# Patient Record
Sex: Male | Born: 2000 | Race: White | Hispanic: No | Marital: Single | Smoking: Never smoker
Health system: Southern US, Community
[De-identification: ages and names within clinical notes are randomized; demographics above are authoritative.]

## PROBLEM LIST (undated history)

## (undated) DIAGNOSIS — R030 Elevated blood-pressure reading, without diagnosis of hypertension: Secondary | ICD-10-CM

## (undated) DIAGNOSIS — F909 Attention-deficit hyperactivity disorder, unspecified type: Secondary | ICD-10-CM

## (undated) DIAGNOSIS — T7840XA Allergy, unspecified, initial encounter: Secondary | ICD-10-CM

## (undated) DIAGNOSIS — K011 Impacted teeth: Secondary | ICD-10-CM

## (undated) DIAGNOSIS — T8859XA Other complications of anesthesia, initial encounter: Secondary | ICD-10-CM

## (undated) DIAGNOSIS — R1110 Vomiting, unspecified: Secondary | ICD-10-CM

## (undated) DIAGNOSIS — H9191 Unspecified hearing loss, right ear: Secondary | ICD-10-CM

## (undated) DIAGNOSIS — E669 Obesity, unspecified: Secondary | ICD-10-CM

## (undated) DIAGNOSIS — T4145XA Adverse effect of unspecified anesthetic, initial encounter: Secondary | ICD-10-CM

## (undated) HISTORY — DX: Elevated blood-pressure reading, without diagnosis of hypertension: R03.0

## (undated) HISTORY — DX: Vomiting, unspecified: R11.10

## (undated) HISTORY — PX: TYMPANOSTOMY TUBE PLACEMENT: SHX32

## (undated) HISTORY — DX: Unspecified hearing loss, right ear: H91.91

---
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
## 2022-02-28 ENCOUNTER — Ambulatory Visit (INDEPENDENT_AMBULATORY_CARE_PROVIDER_SITE_OTHER): Payer: Medicaid Other | Admitting: Family Medicine

## 2022-02-28 ENCOUNTER — Encounter: Payer: Self-pay | Admitting: Family Medicine

## 2022-02-28 ENCOUNTER — Ambulatory Visit: Payer: Medicaid Other | Admitting: Nurse Practitioner

## 2022-02-28 VITALS — BP 126/88 | HR 73 | Temp 97.7°F | Ht 73.5 in | Wt 352.5 lb

## 2022-02-28 DIAGNOSIS — M26609 Unspecified temporomandibular joint disorder, unspecified side: Secondary | ICD-10-CM

## 2022-02-28 DIAGNOSIS — F458 Other somatoform disorders: Secondary | ICD-10-CM | POA: Diagnosis not present

## 2022-02-28 MED ORDER — MELOXICAM 15 MG PO TABS: 15.0000 mg | ORAL_TABLET | Freq: Every day | ORAL | 0 refills | Status: AC | PRN

## 2022-02-28 NOTE — Patient Instructions (Signed)
Temporomandibular Joint Syndrome  Temporomandibular joint syndrome (TMJ syndrome) is a condition that causes pain in the temporomandibular joints. These joints are located near your ears and allow your jaw to open and close. For people with TMJ syndrome, chewing, biting, or other movements of the jaw can be difficult or painful. TMJ syndrome is often mild and goes away within a few weeks. However, sometimes the condition becomes a long-term (chronic) problem. What are the causes? This condition may be caused by: Grinding your teeth or clenching your jaw. Some people do this when they are stressed. Arthritis. An injury to the jaw. A head or neck injury. Teeth or dentures that are not aligned well. In some cases, the cause of TMJ syndrome may not be known. What are the signs or symptoms? The most common symptom of this condition is aching pain on the side of the head in the area of the TMJ. Other symptoms may include: Pain when moving your jaw, such as when chewing or biting. Not being able to open your jaw all the way. Making a clicking sound when you open your mouth. Headache. Earache. Neck or shoulder pain. How is this diagnosed? This condition may be diagnosed based on: Your symptoms and medical history. A physical exam. Your health care provider may check the range of motion of your jaw. Imaging tests, such as X-rays or an MRI. You may also need to see your dentist, who will check if your teeth and jaw are lined up correctly. How is this treated? TMJ syndrome often goes away on its own. If treatment is needed, it may include: Eating soft foods and applying ice or heat. Medicines to relieve pain or inflammation. Medicines or massage to relax the muscles. A splint, bite plate, or mouthpiece to prevent teeth grinding or jaw clenching. Relaxation techniques or counseling to help reduce stress. A therapy for pain in which an electrical current is applied to the nerves through the skin  (transcutaneous electrical nerve stimulation). Acupuncture. This may help to relieve pain. Jaw surgery. This is rarely needed. Follow these instructions at home:  Eating and drinking Eat a soft diet if you are having trouble chewing. Avoid foods that require a lot of chewing. Do not chew gum. General instructions Take over-the-counter and prescription medicines only as told by your health care provider. If directed, put ice on the painful area. To do this: Put ice in a plastic bag. Place a towel between your skin and the bag. Leave the ice on for 20 minutes, 2-3 times a day. Remove the ice if your skin turns bright red. This is very important. If you cannot feel pain, heat, or cold, you have a greater risk of damage to the area. Apply a warm, wet cloth (warm compress) to the painful area as told. Massage your jaw area and do any jaw stretching exercises as told by your health care provider. If you were given a splint, bite plate, or mouthpiece, wear it as told by your health care provider. Keep all follow-up visits. This is important. Where to find more information National Institute of Dental and Craniofacial Research: www.nidcr.nih.gov Contact a health care provider if: You have trouble eating. You have new or worsening symptoms. Get help right away if: Your jaw locks. Summary Temporomandibular joint syndrome (TMJ syndrome) is a condition that causes pain in the temporomandibular joints. These joints are located near your ears and allow your jaw to open and close. TMJ syndrome is often mild and goes away within   a few weeks. However, sometimes the condition becomes a long-term (chronic) problem. Symptoms include an aching pain on the side of the head in the area of the TMJ, pain when chewing or biting, and being unable to open your jaw all the way. You may also make a clicking sound when you open your mouth. TMJ syndrome often goes away on its own. If treatment is needed, it may  include medicines to relieve pain, reduce inflammation, or relax the muscles. A splint, bite plate, or mouthpiece may also be used to prevent teeth grinding or jaw clenching. This information is not intended to replace advice given to you by your health care provider. Make sure you discuss any questions you have with your health care provider. Document Revised: 12/30/2020 Document Reviewed: 12/30/2020 Elsevier Patient Education  2023 Else[employer] Inc.  Jaw Range of Motion Exercises Jaw range of motion exercises are exercises that help your jaw move better. Exercises that help you have good posture (postural exercises) also help relieve jaw discomfort. These are often done along with range of motion exercises. These exercises can help prevent or improve: Trouble opening your mouth. Pain in your jaw while it is open or closed. Temporomandibular joint (TMJ) pain. Headache caused by jaw tension. Take other actions to prevent or relieve jaw pain, such as: Avoiding things that cause or increase jaw pain. This may include: Chewing gum or eating hard foods. Clenching your jaw or teeth, grinding your teeth, or keeping tension in your jaw muscles. Opening your mouth wide, such as for a big yawn. Leaning on your jaw, such as resting your jaw in your hand while leaning on a desk. Putting ice on your jaw. To do this: Put ice in a plastic bag. Place a towel between your skin and the bag. Leave the ice on for 20 minutes, 2-3 times a day. Remove the ice if your skin turns bright red. This is very important. If you cannot feel pain, heat, or cold, you have a greater risk of damage to the area. Only do jaw exercises that your health care provider recommends. Only move your jaw as far as it can comfortably go in each direction. Do not move your jaw into positions that cause pain. Range of motion exercises Repeat each of these exercises 8 times, 1-2 times a day, or as told by your health care provider. Exercise A:  Forward protrusion  Push your jaw forward. Hold this position for 1-2 seconds. Let your jaw return to its normal position and rest it there for 1-2 seconds. Exercise B: Controlled opening  Stand or sit in front of a mirror. Place your tongue on the roof of your mouth, just behind your top teeth. Keeping your tongue on the roof of your mouth, slowly open and close your mouth. While you open and close your mouth, watch your jaw in the mirror. Try to keep your jaw from moving to one side or the other. Exercise C: Right and left motion  Move your jaw right. Hold this position for 1-2 seconds. Let your jaw return to its normal position, and rest it there for 1-2 seconds. Move your jaw left. Hold this position for 1-2 seconds. Let your jaw return to its normal position, and rest it there for 1-2 seconds. Postural exercises Exercise A: Chin tucks  You can do this exercise sitting, standing, or lying down. Move your head straight back, keeping your head level. You can guide the movement by placing your fingers on your chin   to push your jaw back in an even motion. You should be able to feel a double chin form at the end of the motion. Hold this position for 5 seconds. Repeat 10-15 times. Exercise B: Shoulder blade squeeze  Sit or stand. Bend your elbows to about 90 degrees, which is the shape of a capital letter "L." Keep your upper arms by your body. Squeeze your shoulder blades down and back, as though you were trying to touch your elbows behind you. Do not shrug your shoulders or move your head. Hold this position for 5 seconds. Repeat 10-15 times. Exercise C: Chest stretch  Stand in a doorway with one of your feet slightly in front of the other. This is called a staggered stance. If you cannot reach your forearms to the door frame, do this exercise in a corner of a room. Put both of your hands and your forearms on the door frame, with your arms wide apart. Make sure your arms are at a  90-degree angle to your body. Place your hands on the door frame at the height of your elbows. Slowly move your weight onto your front foot until you feel a stretch across your chest and in the front of your shoulders. Keep your head and chest upright and keep your abdominal muscles tight. Do not lean in. Hold this position for 30 seconds. Repeat 3 times. Contact a health care provider if: You have jaw pain that is new or gets worse. You have clicking or popping sounds while doing the exercises. Get help right away if: Your jaw is stuck in one place and you cannot move it. You cannot open or close your mouth. Summary Jaw range of motion exercises are exercises that help your jaw move better. Take actions to prevent or relieve jaw pain: limit chewing gum or eating hard foods; clenching your jaw or teeth; or leaning on your jaw, such as resting your jaw in your hand while leaning on a desk. Repeat each of the jaw range of motion exercises 8 times, 1-2 times a day, or as told by your health care provider. Contact a health care provider if you have clicking or popping sounds while doing the exercises. This information is not intended to replace advice given to you by your health care provider. Make sure you discuss any questions you have with your health care provider. Document Revised: 12/30/2020 Document Reviewed: 12/30/2020 Elsevier Patient Education  2023 Elsevier Inc.  

## 2022-02-28 NOTE — Progress Notes (Signed)
   Acute Office Visit  Subjective:     Patient ID: Frederick Randolph, male    DOB: 2000-09-22, 21 y.o.   MRN: [id number]  Chief Complaint  Patient presents with   Jaw Pain    HPI Patient is in today for jaw popping on the right side for 4-6 weeks. Denies pain. Reports popping occurs intermittently, typically with eating and opening or closing his jaw. Sometimes in the morning he has difficulty opening his jaw. He pops it when this happens and he can then open his jaw. He denies pain. He does clench his teeth frequency and does grind his teeth at night. He has not tried any remedies. He has not had a dental exam in a few years. Denies injury. Denies headaches or ear pain.   ROS As per HPI.      Objective:    BP 126/88   Pulse 73   Temp 97.7 F (36.5 C) (Temporal)   Ht 6' 1.5" (1.867 m)   Wt (!) 352 lb 8 oz (159.9 kg)   SpO2 98%   BMI 45.88 kg/m    Physical Exam Vitals and nursing note reviewed.  Constitutional:      General: He is not in acute distress.    Appearance: He is obese. He is not ill-appearing, toxic-appearing or diaphoretic.  HENT:     Head:     Jaw: Malocclusion (underbite, crossbite present) present. No trismus, tenderness, swelling or pain on movement.  Pulmonary:     Effort: Pulmonary effort is normal. No respiratory distress.  Musculoskeletal:     Right lower leg: No edema.     Left lower leg: No edema.  Neurological:     General: No focal deficit present.     Mental Status: He is alert and oriented to person, place, and time.  Psychiatric:        Mood and Affect: Mood normal.        Behavior: Behavior normal.     No results found for any visits on 02/28/22.      Assessment & Plan:   Christop was seen today for jaw pain.  Diagnoses and all orders for this visit:  TMJ (temporomandibular joint disorder) Bruxism Discussed avoidance of clenching jaw, night guard, jaw exercises, meloxicam as below. Referral placed.  -     Ambulatory referral to [facility] -     meloxicam (MOBIC) 15 MG tablet; Take 1 tablet (15 mg total) by mouth daily as needed for pain.  Return if symptoms worsen or fail to improve.  Gwenlyn Perking, FNP

## 2022-12-08 ENCOUNTER — Ambulatory Visit (INDEPENDENT_AMBULATORY_CARE_PROVIDER_SITE_OTHER): Payer: Medicaid Other | Admitting: Nurse Practitioner

## 2022-12-08 VITALS — BP 132/79 | HR 73 | Temp 98.2°F | Resp 20 | Ht 73.0 in | Wt 285.0 lb

## 2022-12-08 DIAGNOSIS — G4452 New daily persistent headache (NDPH): Secondary | ICD-10-CM | POA: Diagnosis not present

## 2022-12-08 MED ORDER — NAPROXEN 500 MG PO TABS: 500.0000 mg | ORAL_TABLET | Freq: Two times a day (BID) | ORAL | 1 refills | Status: AC

## 2022-12-08 NOTE — Patient Instructions (Signed)
Form - Headache Record There are many types and causes of headaches. A headache record can help guide your treatment plan. Use this form to record the details. Bring this form with you to your follow-up visits. Follow your health care provider's instructions on how to describe your headache. You may be asked to: Use a pain scale. This is a tool to rate the intensity of your headache using words or numbers. Describe what your headache feels like, such as dull, achy, throbbing, or sharp. Headache record Date: _______________ Time (from start to end): ____________________ Location of the headache: _________________________ Intensity of the headache: ____________________ Description of the headache: ______________________________________________________________ Hours of sleep the night before the headache: __________ Food or drinks before the headache started: ______________________________________________________________________________________ Events before the headache started: _______________________________________________________________________________________________ Symptoms before the headache started: __________________________________________________________________________________________ Symptoms during the headache: __________________________________________________________________________________________________ Treatment: ________________________________________________________________________________________________________________ Effect of treatment: _________________________________________________________________________________________________________ Other comments: ___________________________________________________________________________________________________________ Date: _______________ Time (from start to end): ____________________ Location of the headache: _________________________ Intensity of the headache: ____________________ Description of the headache:  ______________________________________________________________ Hours of sleep the night before the headache: __________ Food or drinks before the headache started: ______________________________________________________________________________________ Events before the headache started: ____________________________________________________________________________________________ Symptoms before the headache started: _________________________________________________________________________________________ Symptoms during the headache: _______________________________________________________________________________________________ Treatment: ________________________________________________________________________________________________________________ Effect of treatment: _________________________________________________________________________________________________________ Other comments: ___________________________________________________________________________________________________________ Date: _______________ Time (from start to end): ____________________ Location of the headache: _________________________ Intensity of the headache: ____________________ Description of the headache: ______________________________________________________________ Hours of sleep the night before the headache: __________ Food or drinks before the headache started: ______________________________________________________________________________________ Events before the headache started: ____________________________________________________________________________________________ Symptoms before the headache started: _________________________________________________________________________________________ Symptoms during the headache: _______________________________________________________________________________________________ Treatment:  ________________________________________________________________________________________________________________ Effect of treatment: _________________________________________________________________________________________________________ Other comments: ___________________________________________________________________________________________________________ Date: _______________ Time (from start to end): ____________________ Location of the headache: _________________________ Intensity of the headache: ____________________ Description of the headache: ______________________________________________________________ Hours of sleep the night before the headache: _________ Food or drinks before the headache started: ______________________________________________________________________________________ Events before the headache started: ____________________________________________________________________________________________ Symptoms before the headache started: _________________________________________________________________________________________ Symptoms during the headache: _______________________________________________________________________________________________ Treatment: ________________________________________________________________________________________________________________ Effect of treatment: _________________________________________________________________________________________________________ Other comments: ___________________________________________________________________________________________________________ Date: _______________ Time (from start to end): ____________________ Location of the headache: _________________________ Intensity of the headache: ____________________ Description of the headache: ______________________________________________________________ Hours of sleep the night before the headache: _________ Food or drinks before the headache started:  ______________________________________________________________________________________ Events before the headache started: ____________________________________________________________________________________________ Symptoms before the headache started: _________________________________________________________________________________________ Symptoms during the headache: _______________________________________________________________________________________________ Treatment: ________________________________________________________________________________________________________________ Effect of treatment: _________________________________________________________________________________________________________ Other comments: ___________________________________________________________________________________________________________ This information is not intended to replace advice given to you by your health care provider. Make sure you discuss any questions you have with your health care provider. Document Revised: 10/17/2020 Document Reviewed: 10/17/2020 Elsevier Patient Education  2024 [employer].

## 2022-12-08 NOTE — Progress Notes (Signed)
   Subjective:    Patient ID: Frederick Randolph, male    DOB: 01-25-2001, 22 y.o.   MRN: [id number]   Chief Complaint: Headache (Since last Monday/)   Headache  This is a new problem. The current episode started in the past 7 days. The problem has been waxing and waning. The pain is located in the Occipital region. The pain does not radiate. The pain quality is similar to prior headaches. The quality of the pain is described as aching. The pain is at a severity of 5/10. The pain is moderate. Pertinent negatives include no blurred vision, dizziness, nausea or photophobia. Nothing aggravates the symptoms. He has tried NSAIDs for the symptoms. The treatment provided mild relief.    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  Gastrointestinal:  Negative for nausea.  Neurological:  Positive for headaches. Negative for dizziness.       Objective:   Physical Exam Vitals reviewed.  Constitutional:      Appearance: He is well-developed.  Cardiovascular:     Rate and Rhythm: Normal rate and regular rhythm.     Heart sounds: Normal heart sounds.  Pulmonary:     Effort: Pulmonary effort is normal.     Breath sounds: Normal breath sounds.  Skin:    General: Skin is warm.  Neurological:     General: No focal deficit present.     Mental Status: He is alert and oriented to person, place, and time.  Psychiatric:        Mood and Affect: Mood normal.         Behavior: Behavior normal.    BP 132/79   Pulse 73   Temp 98.2 F (36.8 C) (Temporal)   Resp 20   Ht 6\' 1"  (1.854 m)   Wt 285 lb (129.3 kg)   SpO2 95%   BMI 37.60 kg/m         Assessment & Plan:  Frederick Randolph in today with chief complaint of Headache (Since last Monday/)   1. New daily persistent headache Keep headache diary Avoid caffeine Claritin daily OTC RTO prn - naproxen (NAPROSYN) 500 MG tablet; Take 1 tablet (500 mg total) by mouth 2 (two) times daily with a meal.  Dispense: 60 tablet; Refill: 1    The above assessment and management plan was discussed with the patient. The patient verbalized understanding of and has agreed to the management plan. Patient is aware to call the clinic if symptoms persist or worsen. Patient is aware when to return to the clinic for a follow-up visit. Patient educated on when it is appropriate to go to the emergency department.   Mary-Margaret Daphine Deutscher, FNP

## 2023-08-07 ENCOUNTER — Ambulatory Visit (INDEPENDENT_AMBULATORY_CARE_PROVIDER_SITE_OTHER): Payer: Medicaid Other | Admitting: Nurse Practitioner

## 2023-08-07 ENCOUNTER — Ambulatory Visit: Payer: Medicaid Other | Admitting: Nurse Practitioner

## 2023-08-07 VITALS — BP 149/69 | HR 94 | Temp 97.8°F

## 2023-08-07 DIAGNOSIS — R200 Anesthesia of skin: Secondary | ICD-10-CM | POA: Diagnosis not present

## 2023-08-07 MED ORDER — PREDNISONE 20 MG PO TABS: 40.0000 mg | ORAL_TABLET | Freq: Every day | ORAL | 0 refills | Status: AC

## 2023-08-07 NOTE — Progress Notes (Addendum)
   Subjective:    Patient ID: Frederick Randolph, male    DOB: 2000-09-30, 22 y.o.   MRN: [id number]  Chief Complaint: hand numbness (Left hand. 2 fingers only  Comes and goes)   HPI  Patient in c/o left elbow pain that radiates down and his ring and fifth finger are going numb. Happens randomly. Last several minutes.  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  Eyes:  Negative for pain.  Respiratory:  Negative for shortness of breath.   Cardiovascular:  Negative for chest pain, palpitations and leg swelling.  Gastrointestinal:  Negative for abdominal pain.  Endocrine: Negative for polydipsia.  Skin:  Negative for rash.  Neurological:  Negative for dizziness, weakness and headaches.  Hematological:  Does not bruise/bleed easily.  All other systems reviewed and are negative.      Objective:   Physical Exam Vitals reviewed.  Constitutional:      Appearance: Normal appearance.  Cardiovascular:     Rate and Rhythm: Normal rate and regular rhythm.     Heart sounds: Normal heart sounds.  Pulmonary:     Effort: Pulmonary effort is normal.     Breath sounds: Normal breath sounds.  Musculoskeletal:     Comments: Grips equal bil (-) phalen and tinels left  Skin:    General: Skin is warm.  Neurological:     General: No focal deficit present.     Mental Status: He is alert and oriented to person, place, and time.  Psychiatric:        Mood and Affect: Mood normal.         Behavior: Behavior normal.    BP (!) 149/69   Pulse 94   Temp 97.8 F (36.6 C) (Temporal)   SpO2 99%         Assessment & Plan:  Frederick Randolph in today with chief complaint of hand numbness (Left hand. 2 fingers only  Comes and goes)   1. Numbness of fingers (Primary) Try to see if certain activities cause issues  Meds ordered this encounter  Medications   predniSONE (DELTASONE) 20 MG tablet    Sig: Take 2 tablets (40 mg total) by mouth daily with breakfast for 5 days. 2 po daily for 5 days    Dispense:  10 tablet    Refill:  0    Supervising Provider:   Arville Care A [[id number]]     The above assessment and management plan was discussed with the patient. The patient verbalized understanding of and has agreed to the management plan. Patient is aware to call the clinic if symptoms persist or worsen. Patient is aware when to return to the clinic for a follow-up visit. Patient educated on when it is appropriate to go to the emergency department.   Mary-Margaret Daphine Deutscher, FNP
